# Patient Record
Sex: Female | Born: 1975 | Race: Black or African American | Hispanic: No | Marital: Single | Smoking: Never smoker
Health system: Southern US, Community
[De-identification: ages and names within clinical notes are randomized; demographics above are authoritative.]

## PROBLEM LIST (undated history)

## (undated) HISTORY — PX: ABDOMINAL HYSTERECTOMY: SHX81

## (undated) HISTORY — PX: ADENOIDECTOMY: SUR15

## (undated) HISTORY — PX: CHOLECYSTECTOMY: SHX55

---

## 2014-10-04 ENCOUNTER — Ambulatory Visit
Admission: EM | Admit: 2014-10-04 | Discharge: 2014-10-04 | Disposition: A | Discharge status: Home / Self Care | Payer: Medicaid Other | Attending: Family Medicine | Admitting: Family Medicine

## 2014-10-04 DIAGNOSIS — L02419 Cutaneous abscess of limb, unspecified: Secondary | ICD-10-CM

## 2014-10-04 LAB — PREGNANCY, URINE: PREG TEST UR: NEGATIVE

## 2014-10-04 MED ORDER — SULFAMETHOXAZOLE-TRIMETHOPRIM 800-160 MG PO TABS: 1.0000 | ORAL_TABLET | Freq: Two times a day (BID) | ORAL | Status: DC

## 2014-10-04 MED ORDER — CEFTRIAXONE SODIUM 1 G IJ SOLR
1.0000 g | Freq: Once | INTRAMUSCULAR | Status: AC
  Administered 2014-10-04: 1 g via INTRAMUSCULAR

## 2014-10-04 MED ORDER — KETOROLAC TROMETHAMINE 60 MG/2ML IM SOLN
60.0000 mg | Freq: Once | INTRAMUSCULAR | Status: AC
  Administered 2014-10-04: 60 mg via INTRAMUSCULAR

## 2014-10-04 NOTE — ED Provider Notes (Signed)
CSN: [id number]     Arrival date & time 10/04/14  1638 History   First MD Initiated Contact with Patient 10/04/14 1815     Chief Complaint  Patient presents with  . Abscess   (Consider location/radiation/quality/duration/timing/severity/associated sxs/prior Treatment) HPI  39 yo F presents concerned about large left axillary mass-reported as present  X 4 days and increasing in size- very painful. Denies previous episode, denies DM. Now living in [place] and saw [facility] signs as she left the [employer].   History reviewed. No pertinent past medical history. Past Surgical History  Procedure Laterality Date  . Abdominal hysterectomy      uterine fibroid removal - still has uterus  . Adenoidectomy    . Cholecystectomy     History reviewed. No pertinent family history. Social History  Substance Use Topics  . Smoking status: Never Smoker   . Smokeless tobacco: None  . Alcohol Use: No   OB History    No data available     Review of Systems  Review of 10 systems negative for acute change except as referenced in HPI   Allergies  Review of patient's allergies indicates no known allergies.  Home Medications   Prior to Admission medications   Medication Sig Start Date End Date Taking? Authorizing Provider  cephALEXin (KEFLEX) 500 MG capsule Take 1 capsule (500 mg total) by mouth 4 (four) times daily. 10/05/14   Trixie Dredge, PA-C  HYDROcodone-acetaminophen (NORCO/VICODIN) 5-325 MG per tablet Take 1 tablet by mouth every 4 (four) hours as needed for moderate pain or severe pain. 10/05/14   Trixie Dredge, PA-C  sulfamethoxazole-trimethoprim (BACTRIM DS,SEPTRA DS) 800-160 MG per tablet Take 1 tablet by mouth 2 (two) times daily. 10/05/14 10/12/14  Trixie Dredge, PA-C   Meds Ordered and Administered this Visit   Medications  cefTRIAXone (ROCEPHIN) injection 1 g (1 g Intramuscular Given 10/04/14 1850)  ketorolac (TORADOL) injection 60 mg (60 mg Intramuscular Given 10/04/14 1850)  well  tolerated by patient  BP 137/79 mmHg  Pulse 84  Temp(Src) 98.6 F (37 C) (Tympanic)  Resp 17  Ht 5\' 4"  (1.626 m)  Wt 220 lb (99.791 kg)  BMI 37.74 kg/m2  SpO2 100%  LMP 09/07/2014 (Approximate) No data found.   Physical Exam Constitutional: Alert and oriented, well appearing, VS are noted,  General : mild distress;reporting pain HEENT:  Head:normocephalic, atraumatic,                Eyes: conjugate gaze,negative conjunctiva     Ears:hearing grossly  WNL     Nose:normal,     Mouth/throat :Mucous membranes moist,  Neck :  supple without thyromegaly Lung:   effort and breath sounds normal  Heart:   normal rate,regular rhythm,  Abd :    No distention MSK:   nontender, normal ROM all extremities;normal flexion; ambulatory, on and off table without assistance             Left axilla with large tender mass, suspect abscess-very tender- too large to be addressed in our facility for I&D Neuro: CN ll-Xl as tested,grossly intact; normal gait, normal speech and language Skin:  Warm,dry,intact Psych: mood and affect WNL  ED Course  Procedures (including critical care time)  Labs Review Labs Reviewed  PREGNANCY, URINE   Results for orders placed or performed during the hospital encounter of 10/04/14  Pregnancy, urine  Result Value Ref Range   Preg Test, Ur NEGATIVE NEGATIVE    Imaging Review No results found.  Discussed patient  with Dr Thurmond Butts - the mass is too large for [place] to address in [facility] and she will be referred to [facility] ER near her new home.. Will Rx for pain and add Rocephin IM tonight , These were well tolerated - and gave her information about abscess, and I&D. Started on PO antibiotics,  Use warm packs until  Referral to [facility] tomorrow     MDM   1. Axillary abscess    Plan: 1. Test results and diagnosis reviewed with patient 2. Rx as per orders; risks, benefits, potential side effects reviewed with patient 3. Recommend supportive treatment with ibuprofen  /tylenol cyclic 4. Seek additional medical care if symptoms worsen or don't improve- plans [facility] ER tomorrow  Discharge Medication List as of 10/04/2014  7:07 PM    START taking these medications   Details  sulfamethoxazole-trimethoprim (BACTRIM DS,SEPTRA DS) 800-160 MG per tablet Take 1 tablet by mouth 2 (two) times daily., Starting 10/04/2014, Until Thu 10/11/14, Print          Rae Halsted, PA-C 10/05/14 2225

## 2014-10-04 NOTE — Discharge Instructions (Signed)
°  You had an antibiotic and a pain medication by injections in Urgent Care. I have given you Rx for Oral antibiotic to start ASAP Please go to [facility] ER tomorrow for evaluation Take Ibuprofen 600 -800 mg ( 3-4 tablets ) three times a day with meals for next 2-3 days as needed  Abscess An abscess is an infected area that contains a collection of pus and debris.It can occur in almost any part of the body. An abscess is also known as a furuncle or boil. CAUSES  An abscess occurs when tissue gets infected. This can occur from blockage of oil or sweat glands, infection of hair follicles, or a minor injury to the skin. As the body tries to fight the infection, pus collects in the area and creates pressure under the skin. This pressure causes pain. People with weakened immune systems have difficulty fighting infections and get certain abscesses more often.  SYMPTOMS Usually an abscess develops on the skin and becomes a painful mass that is red, warm, and tender. If the abscess forms under the skin, you may feel a moveable soft area under the skin. Some abscesses break open (rupture) on their own, but most will continue to get worse without care. The infection can spread deeper into the body and eventually into the bloodstream, causing you to feel ill.  DIAGNOSIS  Your caregiver will take your medical history and perform a physical exam. A sample of fluid may also be taken from the abscess to determine what is causing your infection. TREATMENT  Your caregiver may prescribe antibiotic medicines to fight the infection. However, taking antibiotics alone usually does not cure an abscess. Your caregiver may need to make a small cut (incision) in the abscess to drain the pus. In some cases, gauze is packed into the abscess to reduce pain and to continue draining the area. HOME CARE INSTRUCTIONS   Only take over-the-counter or prescription medicines for pain, discomfort, or fever as directed by your  caregiver.  If you were prescribed antibiotics, take them as directed. Finish them even if you start to feel better.  If gauze is used, follow your caregiver's directions for changing the gauze.  To avoid spreading the infection:  Keep your draining abscess covered with a bandage.  Wash your hands well.  Do not share personal care items, towels, or whirlpools with others.  Avoid skin contact with others.  Keep your skin and clothes clean around the abscess.  Keep all follow-up appointments as directed by your caregiver. SEEK MEDICAL CARE IF:   You have increased pain, swelling, redness, fluid drainage, or bleeding.  You have muscle aches, chills, or a general ill feeling.  You have a fever. MAKE SURE YOU:   Understand these instructions.  Will watch your condition.  Will get help right away if you are not doing well or get worse. Document Released: 10/15/2004 Document Revised: 07/07/2011 Document Reviewed: 03/20/2011 [facility] Patient Information 2015 [place], [place]. This information is not intended to replace advice given to you by your health care provider. Make sure you discuss any questions you have with your health care provider.

## 2014-10-04 NOTE — ED Notes (Signed)
States just moved to [facility] on Saturday and Sunday noted "lump" under left axilla. Extremely painful.

## 2014-10-05 ENCOUNTER — Emergency Department (HOSPITAL_COMMUNITY)
Admission: EM | Admit: 2014-10-05 | Discharge: 2014-10-05 | Disposition: A | Discharge status: Home / Self Care | Payer: Self-pay | Attending: Emergency Medicine | Admitting: Emergency Medicine

## 2014-10-05 ENCOUNTER — Encounter (HOSPITAL_COMMUNITY): Payer: Self-pay | Admitting: *Deleted

## 2014-10-05 DIAGNOSIS — L02412 Cutaneous abscess of left axilla: Secondary | ICD-10-CM | POA: Insufficient documentation

## 2014-10-05 DIAGNOSIS — L0390 Cellulitis, unspecified: Secondary | ICD-10-CM

## 2014-10-05 DIAGNOSIS — L03112 Cellulitis of left axilla: Secondary | ICD-10-CM | POA: Insufficient documentation

## 2014-10-05 DIAGNOSIS — L0291 Cutaneous abscess, unspecified: Secondary | ICD-10-CM

## 2014-10-05 MED ORDER — CEPHALEXIN 500 MG PO CAPS: 500.0000 mg | ORAL_CAPSULE | Freq: Four times a day (QID) | ORAL | Status: DC

## 2014-10-05 MED ORDER — SULFAMETHOXAZOLE-TRIMETHOPRIM 800-160 MG PO TABS: 1.0000 | ORAL_TABLET | Freq: Two times a day (BID) | ORAL | Status: AC

## 2014-10-05 MED ORDER — LIDOCAINE-EPINEPHRINE (PF) 2 %-1:200000 IJ SOLN
10.0000 mL | Freq: Once | INTRAMUSCULAR | Status: AC
  Administered 2014-10-05: 10 mL via INTRADERMAL
  Filled 2014-10-05: qty 20

## 2014-10-05 MED ORDER — HYDROCODONE-ACETAMINOPHEN 5-325 MG PO TABS: 1.0000 | ORAL_TABLET | ORAL | Status: DC | PRN

## 2014-10-05 NOTE — ED Provider Notes (Signed)
CSN: [id number]     Arrival date & time 10/05/14  1208 History  This chart was scribed for non-physician practitioner, Trixie Dredge, PA-C working with Margarita Grizzle, MD, by Jarvis Morgan, ED Scribe. This patient was seen in room TR09C/TR09C and the patient's care was started at 12:42 PM.     Chief Complaint  Patient presents with  . Abscess    The history is provided by the patient. No language interpreter was used.    HPI Comments: Hannah Price is a 39 y.o. female who presents to the Emergency Department complaining of a constant, painful, red, mass to left axilla onset 1 week.  She was seen at [facility] yesterday and was given injection of Rocephin, toradol and rx for Bactrim. She was told she needed to be sent here for f/u and possible surgical referral. She denies any h/o abscesses.  Has had small nontender nodule there for years that never bothered her until it recently increased in size and pain.  She has not taken any other meds PTA. She denies any chest pain, SOB, cough, fever or chills, weakness or numbness of the left arm.   History reviewed. No pertinent past medical history. Past Surgical History  Procedure Laterality Date  . Abdominal hysterectomy      uterine fibroid removal - still has uterus  . Adenoidectomy    . Cholecystectomy     No family history on file. Social History  Substance Use Topics  . Smoking status: Never Smoker   . Smokeless tobacco: None  . Alcohol Use: No   OB History    No data available     Review of Systems  Constitutional: Negative for fever and chills.  Respiratory: Negative for cough and shortness of breath.   Cardiovascular: Negative for chest pain.  Musculoskeletal: Negative for myalgias and neck stiffness.  Skin: Positive for color change.  Allergic/Immunologic: Negative for immunocompromised state.  Neurological: Negative for weakness and numbness.  Hematological: Does not bruise/bleed easily.  Psychiatric/Behavioral: Negative for  self-injury.      Allergies  Review of patient's allergies indicates no known allergies.  Home Medications   Prior to Admission medications   Medication Sig Start Date End Date Taking? Authorizing Provider  sulfamethoxazole-trimethoprim (BACTRIM DS,SEPTRA DS) 800-160 MG per tablet Take 1 tablet by mouth 2 (two) times daily. 10/04/14 10/11/14  Rae Halsted, PA-C   Triage Vitals: BP 149/94 mmHg  Pulse 92  Temp(Src) 97.6 F (36.4 C) (Oral)  Resp 18  Ht  (1.626 m)  Wt 226 lb (102.513 kg)  BMI 38.77 kg/m2  SpO2 100%  LMP 09/07/2014 (Approximate)  Physical Exam  Constitutional: She appears well-developed and well-nourished. No distress.  HENT:  Head: Normocephalic and atraumatic.  Neck: Neck supple.  Cardiovascular: Normal rate and intact distal pulses.   Pulmonary/Chest: Effort normal.  Neurological: She is alert. She exhibits normal muscle tone.  Skin: She is not diaphoretic.  Left axilla with area of fluctuance, tenderness to palpation overlying and surrounding erythema and warmth  Psychiatric: She has a normal mood and affect. Her behavior is normal.  Nursing note and vitals reviewed.   ED Course  Procedures (including critical care time)  DIAGNOSTIC STUDIES: Oxygen Saturation is 100% on RA, normal by my interpretation.    COORDINATION OF CARE:    Labs Review Labs Reviewed - No data to display  Imaging Review No results found. I have personally reviewed and evaluated these images and lab results as part of my medical decision-making.  EKG Interpretation None       EMERGENCY DEPARTMENT US SOFT TISSUE INTERPRETATION "Study: Limited Ultrasound of the noted body part in comments below"  INDICATIONS: Soft tissue infection Multiple views of the body part are obtained with a multi-frequency linear probe  PERFORMED BY:  Myself  IMAGES ARCHIVED?: Yes  SIDE:Left  BODY PART:Axilla  FINDINGS: Abcess present and Cellulitis present  LIMITATIONS:  Body  Habitus and Emergent Procedure  INTERPRETATION:  Abcess present and Cellulitis present  COMMENT:  Left axilla with large abscess vs infected sebaceous cyst and overlying cellulitis.    INCISION AND DRAINAGE Performed by: Trixie Dredge Consent: Verbal consent obtained. Risks and benefits: risks, benefits and alternatives were discussed Type: abscess  Body area: left axilla   Anesthesia: local infiltration  Incision was made with a scalpel.  Local anesthetic: lidocaine 2% with epinephrine  Anesthetic total: 5 ml  Complexity: complex Blunt dissection to break up loculations  Drainage: purulent with dark caseous material  Drainage amount: large  Packing material: none  Irrigated with Normal Saline  Patient tolerance: Patient tolerated the procedure well with no immediate complications.     MDM   Final diagnoses:  Abscess and cellulitis    Afebrile, nontoxic patient with abscess and overlying cellulitis of the left axilla.  Likely infected sebaceous cyst.   D/C home with norco, keflex, vicodin, 2 day recheck.   Discussed result, findings, treatment, and follow up  with patient.  Pt given return precautions.  Pt verbalizes understanding and agrees with plan.        I personally performed the services described in this documentation, which was scribed in my presence. The recorded information has been reviewed and is accurate.    Trixie Dredge, PA-C 10/05/14 1425  Margarita Grizzle, MD 10/05/14 1550

## 2014-10-05 NOTE — Discharge Instructions (Signed)
Read the information below.  Use the prescribed medication as directed.  Please discuss all new medications with your pharmacist.  Do not take additional tylenol while taking the prescribed pain medication to avoid overdose.  You may return to the Emergency Department at any time for worsening condition or any new symptoms that concern you.  If you develop increased redness, swelling, uncontrolled pain, or fevers greater than 100.4, return to the ER immediately for a recheck.     Abscess Care After An abscess (also called a boil or furuncle) is an infected area that contains a collection of pus. Signs and symptoms of an abscess include pain, tenderness, redness, or hardness, or you may feel a moveable soft area under your skin. An abscess can occur anywhere in the body. The infection may spread to surrounding tissues causing cellulitis. A cut (incision) by the surgeon was made over your abscess and the pus was drained out. Gauze may have been packed into the space to provide a drain that will allow the cavity to heal from the inside outwards. The boil may be painful for 5 to 7 days. Most people with a boil do not have high fevers. Your abscess, if seen early, may not have localized, and may not have been lanced. If not, another appointment may be required for this if it does not get better on its own or with medications. HOME CARE INSTRUCTIONS   Only take over-the-counter or prescription medicines for pain, discomfort, or fever as directed by your caregiver.  When you bathe, soak and then remove gauze or iodoform packs at least daily or as directed by your caregiver. You may then wash the wound gently with mild soapy water. Repack with gauze or do as your caregiver directs. SEEK IMMEDIATE MEDICAL CARE IF:   You develop increased pain, swelling, redness, drainage, or bleeding in the wound site.  You develop signs of generalized infection including muscle aches, chills, fever, or a general ill  feeling.  An oral temperature above 102 F (38.9 C) develops, not controlled by medication. See your caregiver for a recheck if you develop any of the symptoms described above. If medications (antibiotics) were prescribed, take them as directed. Document Released: 07/24/2004 Document Revised: 03/30/2011 Document Reviewed: 03/21/2007 [facility] Patient Information 2015 [place], [place]. This information is not intended to replace advice given to you by your health care provider. Make sure you discuss any questions you have with your health care provider.  Abscess An abscess is an infected area that contains a collection of pus and debris.It can occur in almost any part of the body. An abscess is also known as a furuncle or boil. CAUSES  An abscess occurs when tissue gets infected. This can occur from blockage of oil or sweat glands, infection of hair follicles, or a minor injury to the skin. As the body tries to fight the infection, pus collects in the area and creates pressure under the skin. This pressure causes pain. People with weakened immune systems have difficulty fighting infections and get certain abscesses more often.  SYMPTOMS Usually an abscess develops on the skin and becomes a painful mass that is red, warm, and tender. If the abscess forms under the skin, you may feel a moveable soft area under the skin. Some abscesses break open (rupture) on their own, but most will continue to get worse without care. The infection can spread deeper into the body and eventually into the bloodstream, causing you to feel ill.  DIAGNOSIS  Your  caregiver will take your medical history and perform a physical exam. A sample of fluid may also be taken from the abscess to determine what is causing your infection. TREATMENT  Your caregiver may prescribe antibiotic medicines to fight the infection. However, taking antibiotics alone usually does not cure an abscess. Your caregiver may need to make a small cut  (incision) in the abscess to drain the pus. In some cases, gauze is packed into the abscess to reduce pain and to continue draining the area. HOME CARE INSTRUCTIONS   Only take over-the-counter or prescription medicines for pain, discomfort, or fever as directed by your caregiver.  If you were prescribed antibiotics, take them as directed. Finish them even if you start to feel better.  If gauze is used, follow your caregiver's directions for changing the gauze.  To avoid spreading the infection:  Keep your draining abscess covered with a bandage.  Wash your hands well.  Do not share personal care items, towels, or whirlpools with others.  Avoid skin contact with others.  Keep your skin and clothes clean around the abscess.  Keep all follow-up appointments as directed by your caregiver. SEEK MEDICAL CARE IF:   You have increased pain, swelling, redness, fluid drainage, or bleeding.  You have muscle aches, chills, or a general ill feeling.  You have a fever. MAKE SURE YOU:   Understand these instructions.  Will watch your condition.  Will get help right away if you are not doing well or get worse. Document Released: 10/15/2004 Document Revised: 07/07/2011 Document Reviewed: 03/20/2011 [facility] Patient Information 2015 [place], [place]. This information is not intended to replace advice given to you by your health care provider. Make sure you discuss any questions you have with your health care provider.  Cellulitis Cellulitis is an infection of the skin and the tissue beneath it. The infected area is usually red and tender. Cellulitis occurs most often in the arms and lower legs.  CAUSES  Cellulitis is caused by bacteria that enter the skin through cracks or cuts in the skin. The most common types of bacteria that cause cellulitis are staphylococci and streptococci. SIGNS AND SYMPTOMS   Redness and warmth.  Swelling.  Tenderness or pain.  Fever. DIAGNOSIS  Your  health care provider can usually determine what is wrong based on a physical exam. Blood tests may also be done. TREATMENT  Treatment usually involves taking an antibiotic medicine. HOME CARE INSTRUCTIONS   Take your antibiotic medicine as directed by your health care provider. Finish the antibiotic even if you start to feel better.  Keep the infected arm or leg elevated to reduce swelling.  Apply a warm cloth to the affected area up to 4 times per day to relieve pain.  Take medicines only as directed by your health care provider.  Keep all follow-up visits as directed by your health care provider. SEEK MEDICAL CARE IF:   You notice red streaks coming from the infected area.  Your red area gets larger or turns dark in color.  Your bone or joint underneath the infected area becomes painful after the skin has healed.  Your infection returns in the same area or another area.  You notice a swollen bump in the infected area.  You develop new symptoms.  You have a fever. SEEK IMMEDIATE MEDICAL CARE IF:   You feel very sleepy.  You develop vomiting or diarrhea.  You have a general ill feeling (malaise) with muscle aches and pains. MAKE SURE YOU:  Understand these instructions.  Will watch your condition.  Will get help right away if you are not doing well or get worse. Document Released: 10/15/2004 Document Revised: 05/22/2013 Document Reviewed: 03/23/2011 [facility] Patient Information 2015 [place], [place]. This information is not intended to replace advice given to you by your health care provider. Make sure you discuss any questions you have with your health care provider.

## 2014-10-05 NOTE — ED Notes (Signed)
Pt states she was UC yesterday for abscess to L axilla.  Was given IM rocephin and told to come here so the abscess could be drained.

## 2014-10-08 ENCOUNTER — Encounter (HOSPITAL_COMMUNITY): Payer: Self-pay | Admitting: Emergency Medicine

## 2014-10-08 ENCOUNTER — Emergency Department (INDEPENDENT_AMBULATORY_CARE_PROVIDER_SITE_OTHER)
Admission: EM | Admit: 2014-10-08 | Discharge: 2014-10-08 | Disposition: A | Discharge status: Home / Self Care | Payer: Medicaid Other | Source: Home / Self Care | Attending: Family Medicine | Admitting: Family Medicine

## 2014-10-08 DIAGNOSIS — Z09 Encounter for follow-up examination after completed treatment for conditions other than malignant neoplasm: Secondary | ICD-10-CM

## 2014-10-08 NOTE — Discharge Instructions (Signed)
Warm cloth soaks and finish antibiotics, return as needed.

## 2014-10-08 NOTE — ED Notes (Signed)
Seen at [facility] ucc on 9/15.  Prescribed medications and sent to [place] ed on 9/16.  I/d of wound.  Today here for follow up.  Patient feels much better.  Wound to left axilla, healing wound.

## 2014-10-08 NOTE — ED Provider Notes (Signed)
CSN: [id number]     Arrival date & time 10/08/14  1308 History   First MD Initiated Contact with Patient 10/08/14 1348     Chief Complaint  Patient presents with  . Follow-up   (Consider location/radiation/quality/duration/timing/severity/associated sxs/prior Treatment) Patient is a 39 y.o. female presenting with abscess. The history is provided by the patient.  Abscess Location:  Shoulder/arm Shoulder/arm abscess location:  L axilla Abscess quality: fluctuance and painful   Red streaking: no   Duration:  3 days Progression:  Partially resolved (had I+D on 9/16 in er, pt has changed dsg, no packing  seen) Associated symptoms: no fever     History reviewed. No pertinent past medical history. Past Surgical History  Procedure Laterality Date  . Abdominal hysterectomy      uterine fibroid removal - still has uterus  . Adenoidectomy    . Cholecystectomy     No family history on file. Social History  Substance Use Topics  . Smoking status: Never Smoker   . Smokeless tobacco: None  . Alcohol Use: No   OB History    No data available     Review of Systems  Constitutional: Negative for fever.  Skin: Positive for rash and wound.  All other systems reviewed and are negative.   Allergies  Review of patient's allergies indicates no known allergies.  Home Medications   Prior to Admission medications   Medication Sig Start Date End Date Taking? Authorizing Provider  cephALEXin (KEFLEX) 500 MG capsule Take 1 capsule (500 mg total) by mouth 4 (four) times daily. 10/05/14   Trixie Dredge, PA-C  HYDROcodone-acetaminophen (NORCO/VICODIN) 5-325 MG per tablet Take 1 tablet by mouth every 4 (four) hours as needed for moderate pain or severe pain. 10/05/14   Trixie Dredge, PA-C  sulfamethoxazole-trimethoprim (BACTRIM DS,SEPTRA DS) 800-160 MG per tablet Take 1 tablet by mouth 2 (two) times daily. 10/05/14 10/12/14  Trixie Dredge, PA-C   Meds Ordered and Administered this Visit  Medications - No  data to display  BP 115/72 mmHg  Pulse 86  Temp(Src) 98.2 F (36.8 C) (Oral)  Resp 12  SpO2 100%  LMP 10/08/2014 No data found.   Physical Exam  Constitutional: She is oriented to person, place, and time. She appears well-developed and well-nourished. No distress.  Neurological: She is alert and oriented to person, place, and time.  Skin: Skin is warm and dry. No erythema.  Sl fluctuant mass to left axilla, purulent fluid expressed.   Nursing note and vitals reviewed.   ED Course  Procedures (including critical care time)  Labs Review Labs Reviewed - No data to display  Imaging Review No results found.   Visual Acuity Review  Right Eye Distance:   Left Eye Distance:   Bilateral Distance:    Right Eye Near:   Left Eye Near:    Bilateral Near:         MDM   1. Encounter for recheck of abscess following incision and drainage        Linna Hoff, MD 10/08/14 1424

## 2015-11-09 ENCOUNTER — Emergency Department (HOSPITAL_COMMUNITY): Payer: Medicaid Other

## 2015-11-09 ENCOUNTER — Encounter (HOSPITAL_COMMUNITY): Payer: Self-pay | Admitting: Emergency Medicine

## 2015-11-09 ENCOUNTER — Emergency Department (HOSPITAL_COMMUNITY)
Admission: EM | Admit: 2015-11-09 | Discharge: 2015-11-09 | Disposition: A | Discharge status: Home / Self Care | Payer: Medicaid Other | Attending: Emergency Medicine | Admitting: Emergency Medicine

## 2015-11-09 DIAGNOSIS — R51 Headache: Secondary | ICD-10-CM | POA: Diagnosis not present

## 2015-11-09 DIAGNOSIS — D500 Iron deficiency anemia secondary to blood loss (chronic): Secondary | ICD-10-CM | POA: Diagnosis not present

## 2015-11-09 DIAGNOSIS — N938 Other specified abnormal uterine and vaginal bleeding: Secondary | ICD-10-CM

## 2015-11-09 DIAGNOSIS — R519 Headache, unspecified: Secondary | ICD-10-CM

## 2015-11-09 LAB — URINE MICROSCOPIC-ADD ON

## 2015-11-09 LAB — CBC WITH DIFFERENTIAL/PLATELET
BASOS ABS: 0.1 10*3/uL (ref 0.0–0.1)
Basophils Relative: 1 %
Eosinophils Absolute: 0.1 10*3/uL (ref 0.0–0.7)
Eosinophils Relative: 2 %
HEMATOCRIT: 29.6 % — AB (ref 36.0–46.0)
Hemoglobin: 8.7 g/dL — ABNORMAL LOW (ref 12.0–15.0)
LYMPHS ABS: 1.5 10*3/uL (ref 0.7–4.0)
LYMPHS PCT: 29 %
MCH: 19.8 pg — ABNORMAL LOW (ref 26.0–34.0)
MCHC: 29.4 g/dL — ABNORMAL LOW (ref 30.0–36.0)
MCV: 67.4 fL — ABNORMAL LOW (ref 78.0–100.0)
MONOS PCT: 8 %
Monocytes Absolute: 0.4 10*3/uL (ref 0.1–1.0)
NEUTROS PCT: 60 %
Neutro Abs: 3.1 10*3/uL (ref 1.7–7.7)
Platelets: 387 10*3/uL (ref 150–400)
RBC: 4.39 MIL/uL (ref 3.87–5.11)
RDW: 16.9 % — AB (ref 11.5–15.5)
WBC: 5.2 10*3/uL (ref 4.0–10.5)

## 2015-11-09 LAB — COMPREHENSIVE METABOLIC PANEL
ALT: 10 U/L — ABNORMAL LOW (ref 14–54)
ANION GAP: 7 (ref 5–15)
AST: 16 U/L (ref 15–41)
Albumin: 3.7 g/dL (ref 3.5–5.0)
Alkaline Phosphatase: 61 U/L (ref 38–126)
BILIRUBIN TOTAL: 0.9 mg/dL (ref 0.3–1.2)
BUN: 8 mg/dL (ref 6–20)
CO2: 25 mmol/L (ref 22–32)
Calcium: 8.8 mg/dL — ABNORMAL LOW (ref 8.9–10.3)
Chloride: 107 mmol/L (ref 101–111)
Creatinine, Ser: 0.71 mg/dL (ref 0.44–1.00)
Glucose, Bld: 88 mg/dL (ref 65–99)
POTASSIUM: 3.5 mmol/L (ref 3.5–5.1)
Sodium: 139 mmol/L (ref 135–145)
TOTAL PROTEIN: 7.7 g/dL (ref 6.5–8.1)

## 2015-11-09 LAB — URINALYSIS, ROUTINE W REFLEX MICROSCOPIC
Glucose, UA: NEGATIVE mg/dL
KETONES UR: 15 mg/dL — AB
NITRITE: POSITIVE — AB
PH: 7 (ref 5.0–8.0)
PROTEIN: 100 mg/dL — AB
Specific Gravity, Urine: 1.017 (ref 1.005–1.030)

## 2015-11-09 LAB — PREGNANCY, URINE: Preg Test, Ur: NEGATIVE

## 2015-11-09 MED ORDER — MEDROXYPROGESTERONE ACETATE 10 MG PO TABS: 10.0000 mg | ORAL_TABLET | Freq: Every day | ORAL | 0 refills | Status: DC

## 2015-11-09 MED ORDER — METOCLOPRAMIDE HCL 5 MG/ML IJ SOLN
10.0000 mg | Freq: Once | INTRAMUSCULAR | Status: AC
  Administered 2015-11-09: 10 mg via INTRAVENOUS
  Filled 2015-11-09: qty 2

## 2015-11-09 MED ORDER — SODIUM CHLORIDE 0.9 % IV BOLUS (SEPSIS)
1000.0000 mL | Freq: Once | INTRAVENOUS | Status: AC
  Administered 2015-11-09: 1000 mL via INTRAVENOUS

## 2015-11-09 MED ORDER — DIPHENHYDRAMINE HCL 50 MG/ML IJ SOLN
25.0000 mg | Freq: Once | INTRAMUSCULAR | Status: AC
  Administered 2015-11-09: 25 mg via INTRAVENOUS
  Filled 2015-11-09: qty 1

## 2015-11-09 NOTE — ED Provider Notes (Signed)
[facility] DEPT Provider Note   CSN: [id number] Arrival date & time: 11/09/15  0813     History   Chief Complaint Chief Complaint  Patient presents with  . Headache    HPI Hannah Price is a 40 y.o. female.  Pt presents to the ED this morning due to a headache that has lasted for 5 days.  Pt said she has never had a headache like this in the past.  She said the pain in on the right side of her head.  She feels nauseous, but has not been vomiting.      History reviewed. No pertinent past medical history.  There are no active problems to display for this patient.   Past Surgical History:  Procedure Laterality Date  . ABDOMINAL HYSTERECTOMY     uterine fibroid removal - still has uterus  . ADENOIDECTOMY    . CHOLECYSTECTOMY      OB History    No data available       Home Medications    Prior to Admission medications   Medication Sig Start Date End Date Taking? Authorizing Provider  cephALEXin (KEFLEX) 500 MG capsule Take 1 capsule (500 mg total) by mouth 4 (four) times daily. Patient not taking: Reported on 11/09/2015 10/05/14   Trixie Dredge, PA-C  HYDROcodone-acetaminophen (NORCO/VICODIN) 5-325 MG per tablet Take 1 tablet by mouth every 4 (four) hours as needed for moderate pain or severe pain. Patient not taking: Reported on 11/09/2015 10/05/14   Trixie Dredge, PA-C  medroxyPROGESTERone (PROVERA) 10 MG tablet Take 1 tablet (10 mg total) by mouth daily. 11/09/15   Jacalyn Lefevre, MD    Family History No family history on file.  Social History Social History  Substance Use Topics  . Smoking status: Never Smoker  . Smokeless tobacco: Never Used  . Alcohol use No     Allergies   Review of patient's allergies indicates no known allergies.   Review of Systems Review of Systems  Gastrointestinal: Positive for nausea.  Neurological: Positive for headaches.  All other systems reviewed and are negative.    Physical Exam Updated Vital Signs BP 146/80  (BP Location: Right Arm)   Pulse 81   Temp 99.3 F (37.4 C) (Oral)   Resp 17   Ht 5\' 4"  (1.626 m)   Wt 220 lb (99.8 kg)   LMP 11/08/2015   SpO2 99%   BMI 37.76 kg/m   Physical Exam  Constitutional: She is oriented to person, place, and time. She appears well-developed and well-nourished.  HENT:  Head: Normocephalic and atraumatic.  Right Ear: External ear normal.  Left Ear: External ear normal.  Nose: Nose normal.  Mouth/Throat: Oropharynx is clear and moist.  Eyes: Conjunctivae and EOM are normal. Pupils are equal, round, and reactive to light.  Neck: Normal range of motion. Neck supple.  Cardiovascular: Normal rate, regular rhythm, normal heart sounds and intact distal pulses.   Pulmonary/Chest: Effort normal and breath sounds normal.  Abdominal: Soft. Bowel sounds are normal.  Musculoskeletal: Normal range of motion.  Neurological: She is alert and oriented to person, place, and time.  Skin: Skin is warm.  Psychiatric: She has a normal mood and affect. Her behavior is normal. Judgment and thought content normal.  Nursing note and vitals reviewed.    ED Treatments / Results  Labs (all labs ordered are listed, but only abnormal results are displayed) Labs Reviewed  COMPREHENSIVE METABOLIC PANEL - Abnormal; Notable for the following:  Result Value   Calcium 8.8 (*)    ALT 10 (*)    All other components within normal limits  CBC WITH DIFFERENTIAL/PLATELET - Abnormal; Notable for the following:    Hemoglobin 8.7 (*)    HCT 29.6 (*)    MCV 67.4 (*)    MCH 19.8 (*)    MCHC 29.4 (*)    RDW 16.9 (*)    All other components within normal limits  URINALYSIS, ROUTINE W REFLEX MICROSCOPIC (NOT AT [facility]) - Abnormal; Notable for the following:    Color, Urine RED (*)    APPearance TURBID (*)    Hgb urine dipstick LARGE (*)    Bilirubin Urine MODERATE (*)    Ketones, ur 15 (*)    Protein, ur 100 (*)    Nitrite POSITIVE (*)    Leukocytes, UA MODERATE (*)    All other  components within normal limits  URINE MICROSCOPIC-ADD ON - Abnormal; Notable for the following:    Squamous Epithelial / LPF 0-5 (*)    Bacteria, UA FEW (*)    All other components within normal limits  PREGNANCY, URINE    EKG  EKG Interpretation None       Radiology Ct Head Wo Contrast  Result Date: 11/09/2015 CLINICAL DATA:  Headache for 5 days. EXAM: CT HEAD WITHOUT CONTRAST TECHNIQUE: Contiguous axial images were obtained from the base of the skull through the vertex without intravenous contrast. COMPARISON:  None. FINDINGS: Brain: No evidence of acute infarction, hemorrhage, hydrocephalus, extra-axial collection or mass lesion/mass effect. Vascular: No hyperdense vessel or unexpected calcification. Skull: Normal. Negative for fracture or focal lesion. Sinuses/Orbits: No acute finding. Other: None IMPRESSION: Normal unenhanced CT scan of the brain. Electronically Signed   By: Amie Portlandavid  Ormond M.D.   On: 11/09/2015 09:17    Procedures Procedures (including critical care time)  Medications Ordered in ED Medications  sodium chloride 0.9 % bolus 1,000 mL (1,000 mLs Intravenous New Bag/Given 11/09/15 0831)  metoCLOPramide (REGLAN) injection 10 mg (10 mg Intravenous Given 11/09/15 0831)  diphenhydrAMINE (BENADRYL) injection 25 mg (25 mg Intravenous Given 11/09/15 0831)     Initial Impression / Assessment and Plan / ED Course  I have reviewed the triage vital signs and the nursing notes.  Pertinent labs & imaging results that were available during my care of the patient were reviewed by me and considered in my medical decision making (see chart for details).  Clinical Course   Pt is feeling better.  She is on her period which is why she has blood in her urine.  I asked the pt if she has heavy periods and she said yes.  She takes iron for her chronic anemia.  I told her to increase it to BID.  She has a hx of fibroids, and had them removed years ago.  She has not seen Gyn recently.   She does not have 1 in town.  She is given provera and instructed to f/u with gyn.  Her headache is likely related to her anemia.  Pt knows to return if worse.   Final Clinical Impressions(s) / ED Diagnoses   Final diagnoses:  Acute nonintractable headache, unspecified headache type  DUB (dysfunctional uterine bleeding)  Iron deficiency anemia due to chronic blood loss    New Prescriptions New Prescriptions   MEDROXYPROGESTERONE (PROVERA) 10 MG TABLET    Take 1 tablet (10 mg total) by mouth daily.     Jacalyn LefevreJulie Phinneas Shakoor, MD 11/09/15 1012

## 2015-11-09 NOTE — ED Triage Notes (Signed)
Pt. Stated, I've had a headache for the last 5 days. No other symptoms.  I've taken Advil.

## 2015-11-09 NOTE — ED Notes (Signed)
EDP at bedside  

## 2015-11-09 NOTE — Discharge Instructions (Signed)
Take your iron twice a day.

## 2015-12-31 ENCOUNTER — Ambulatory Visit (HOSPITAL_COMMUNITY)
Admission: EM | Admit: 2015-12-31 | Discharge: 2015-12-31 | Disposition: A | Discharge status: Home / Self Care | Payer: Medicaid Other | Attending: Family Medicine | Admitting: Family Medicine

## 2015-12-31 ENCOUNTER — Encounter (HOSPITAL_COMMUNITY): Payer: Self-pay | Admitting: Family Medicine

## 2015-12-31 DIAGNOSIS — Z3201 Encounter for pregnancy test, result positive: Secondary | ICD-10-CM | POA: Diagnosis not present

## 2015-12-31 LAB — POCT PREGNANCY, URINE: Preg Test, Ur: POSITIVE — AB

## 2015-12-31 NOTE — ED Triage Notes (Signed)
Pt sts that she hasn't feeling well and she wants a pregnancy test.

## 2015-12-31 NOTE — ED Provider Notes (Signed)
CSN: [id number]     Arrival date & time 12/31/15  1549 History   First MD Initiated Contact with Patient 12/31/15 1604     Chief Complaint  Patient presents with  . Possible Pregnancy   (Consider location/radiation/quality/duration/timing/severity/associated sxs/prior Treatment) 40 year old female presents to the urgent care stating that she feels tired and treatments it to having to work a lot lately. She has no other symptoms. Chest no pelvic pain or pelvic bleeding. She request to have a pregnancy test. She states she had scant amount of vaginal bleeding approximately 2 weeks ago but was not like a regular menses.      History reviewed. No pertinent past medical history. Past Surgical History:  Procedure Laterality Date  . ABDOMINAL HYSTERECTOMY     uterine fibroid removal - still has uterus  . ADENOIDECTOMY    . CHOLECYSTECTOMY     History reviewed. No pertinent family history. Social History  Substance Use Topics  . Smoking status: Never Smoker  . Smokeless tobacco: Never Used  . Alcohol use No   OB History    No data available     Review of Systems  Constitutional: Positive for fatigue.  HENT: Negative.   Respiratory: Negative.   Genitourinary: Negative.   Musculoskeletal: Negative.   Neurological: Negative.   All other systems reviewed and are negative.   Allergies  Patient has no known allergies.  Home Medications   Prior to Admission medications   Not on File   Meds Ordered and Administered this Visit  Medications - No data to display  BP 145/76   Pulse 91   Temp 98.4 F (36.9 C)   Resp 18   LMP 11/21/2015   SpO2 100%  No data found.   Physical Exam  Constitutional: She is oriented to person, place, and time. She appears well-developed and well-nourished. No distress.  Eyes: EOM are normal.  Neck: Normal range of motion. Neck supple.  Cardiovascular: Normal rate.   Pulmonary/Chest: Effort normal. No respiratory distress.   Musculoskeletal: She exhibits no edema.  Neurological: She is alert and oriented to person, place, and time. She exhibits normal muscle tone.  Skin: Skin is warm and dry.  Psychiatric: She has a normal mood and affect.  Nursing note and vitals reviewed.   Urgent Care Course   Clinical Course     Procedures (including critical care time)  Labs Review Labs Reviewed  POCT PREGNANCY, URINE - Abnormal; Notable for the following:       Result Value   Preg Test, Ur POSITIVE (*)    All other components within normal limits    Imaging Review No results found.   Visual Acuity Review  Right Eye Distance:   Left Eye Distance:   Bilateral Distance:    Right Eye Near:   Left Eye Near:    Bilateral Near:         MDM   1. Positive pregnancy test    Your pregnancy test was positive. Follow with your primary care provider. Should you have any difficulties with your pregnancy that are urgent matters [facility].     Hayden Rasmussenavid Tylik Treese, NP 12/31/15 [phone]

## 2015-12-31 NOTE — Discharge Instructions (Signed)
Your pregnancy test was positive. Follow with your primary care provider. Should you have any difficulties with your pregnancy that are urgent matters [facility].

## 2016-05-31 ENCOUNTER — Encounter (HOSPITAL_COMMUNITY): Payer: Self-pay

## 2016-05-31 ENCOUNTER — Emergency Department (HOSPITAL_COMMUNITY)
Admission: EM | Admit: 2016-05-31 | Discharge: 2016-05-31 | Disposition: A | Discharge status: Home / Self Care | Payer: Medicaid Other | Attending: Emergency Medicine | Admitting: Emergency Medicine

## 2016-05-31 DIAGNOSIS — T7840XA Allergy, unspecified, initial encounter: Secondary | ICD-10-CM

## 2016-05-31 DIAGNOSIS — H11423 Conjunctival edema, bilateral: Secondary | ICD-10-CM

## 2016-05-31 DIAGNOSIS — H578 Other specified disorders of eye and adnexa: Secondary | ICD-10-CM | POA: Diagnosis present

## 2016-05-31 MED ORDER — DIPHENHYDRAMINE HCL 25 MG PO TABS: 25.0000 mg | ORAL_TABLET | Freq: Four times a day (QID) | ORAL | 0 refills | Status: AC

## 2016-05-31 MED ORDER — EPINEPHRINE 0.3 MG/0.3ML IJ SOAJ: 0.3000 mg | Freq: Once | INTRAMUSCULAR | 1 refills | Status: AC

## 2016-05-31 MED ORDER — FAMOTIDINE 20 MG PO TABS: 20.0000 mg | ORAL_TABLET | Freq: Two times a day (BID) | ORAL | 0 refills | Status: AC

## 2016-05-31 MED ORDER — FAMOTIDINE 20 MG PO TABS
20.0000 mg | ORAL_TABLET | Freq: Once | ORAL | Status: AC
  Administered 2016-05-31: 20 mg via ORAL
  Filled 2016-05-31: qty 1

## 2016-05-31 MED ORDER — PREDNISONE 20 MG PO TABS
60.0000 mg | ORAL_TABLET | Freq: Once | ORAL | Status: AC
  Administered 2016-05-31: 60 mg via ORAL
  Filled 2016-05-31: qty 3

## 2016-05-31 MED ORDER — DIPHENHYDRAMINE HCL 25 MG PO CAPS
25.0000 mg | ORAL_CAPSULE | Freq: Once | ORAL | Status: AC
  Administered 2016-05-31: 25 mg via ORAL
  Filled 2016-05-31: qty 1

## 2016-05-31 MED ORDER — PREDNISONE 20 MG PO TABS: 60.0000 mg | ORAL_TABLET | Freq: Every day | ORAL | 0 refills | Status: AC

## 2016-05-31 NOTE — Discharge Instructions (Signed)
Use the medications as prescribed except for the epinephrine. Using that for severe allergic reaction as we discussed.  You can also try applying ice to help with the swelling.  You can also use over-the-counter allergy eyedrops.

## 2016-05-31 NOTE — ED Triage Notes (Signed)
Patient here with bilateral eye redness and itching with swelling to eyes since eating fruit and beans this am. No rash, no tongue swelling, NAD

## 2016-05-31 NOTE — ED Provider Notes (Signed)
[facility] Provider Note   CSN: [id number] Arrival date & time: 05/31/16  [id number]     History   Chief Complaint Chief Complaint  Patient presents with  . eye redness/eye swelling    HPI Hannah Price is a 41 y.o. female.  HPI Pt presents to the ED with what she thinks is an allergic reaction.  She at some fruit and beans this am.  She had sudden onset of eye itching, redness and swelling bilaterally.   She denies any shortness of breath, throat swelling, rashes, difficulty swallowing or breathing.  She had not touched her eyes prior to the episode.  The sx persisted so she came to the ED.  She has not had anything like this in the past.    History reviewed. No pertinent past medical history.  There are no active problems to display for this patient.   Past Surgical History:  Procedure Laterality Date  . ABDOMINAL HYSTERECTOMY     uterine fibroid removal - still has uterus  . ADENOIDECTOMY    . CHOLECYSTECTOMY      OB History    No data available       Home Medications    Prior to Admission medications   Medication Sig Start Date End Date Taking? Authorizing Provider  diphenhydrAMINE (BENADRYL) 25 MG tablet Take 1 tablet (25 mg total) by mouth every 6 (six) hours. 05/31/16   Linwood DibblesKnapp, Emersyn Wyss, MD  EPINEPHrine 0.3 mg/0.3 mL IJ SOAJ injection Inject 0.3 mLs (0.3 mg total) into the muscle once. As needed For severe allergic reaction 05/31/16 05/31/16  Linwood DibblesKnapp, Jeffie Widdowson, MD  famotidine (PEPCID) 20 MG tablet Take 1 tablet (20 mg total) by mouth 2 (two) times daily. 05/31/16   Linwood DibblesKnapp, Dorlis Judice, MD  predniSONE (DELTASONE) 20 MG tablet Take 3 tablets (60 mg total) by mouth daily. 05/31/16   Linwood DibblesKnapp, Bree Heinzelman, MD    Family History No family history on file.  Social History Social History  Substance Use Topics  . Smoking status: Never Smoker  . Smokeless tobacco: Never Used  . Alcohol use No     Allergies   Patient has no known allergies.   Review of Systems Review of Systems  All other  systems reviewed and are negative.    Physical Exam Updated Vital Signs BP 129/89   Pulse 73   Temp 98.4 F (36.9 C) (Oral)   Resp 16   SpO2 100%   Physical Exam  Constitutional: She appears well-developed and well-nourished. No distress.  HENT:  Head: Normocephalic and atraumatic.  Right Ear: External ear normal.  Left Ear: External ear normal.  Mouth/Throat: Mucous membranes are normal. No uvula swelling. No oropharyngeal exudate, posterior oropharyngeal edema or posterior oropharyngeal erythema.  Eyes: EOM are normal. Right eye exhibits no discharge. Left eye exhibits no discharge. Right conjunctiva is injected. Left conjunctiva is injected. No scleral icterus.  Excessive tearing,  Edema of the upper and lower eyelids  Neck: Neck supple. No tracheal deviation present.  Cardiovascular: Normal rate and regular rhythm.   Pulmonary/Chest: Effort normal. No stridor. No respiratory distress. She has no wheezes. She has no rales.  Abdominal: She exhibits no distension.  Musculoskeletal: She exhibits no edema.  Neurological: She is alert. Cranial nerve deficit: no gross deficits.  Skin: Skin is warm and dry. No rash noted. Rash is not urticarial.  Psychiatric: She has a normal mood and affect.  Nursing note and vitals reviewed.    ED Treatments / Results    Procedures Procedures (  including critical care time)  Medications Ordered in ED Medications  diphenhydrAMINE (BENADRYL) capsule 25 mg (25 mg Oral Given 05/31/16 1024)  famotidine (PEPCID) tablet 20 mg (20 mg Oral Given 05/31/16 1024)  predniSONE (DELTASONE) tablet 60 mg (60 mg Oral Given 05/31/16 1024)     Initial Impression / Assessment and Plan / ED Course  I have reviewed the triage vital signs and the nursing notes.  Pertinent labs & imaging results that were available during my care of the patient were reviewed by me and considered in my medical decision making (see chart for details).   patient presented to the  emergency room with complaints of eye swelling and irritation. She denies rubbing her eyes are getting any chemicals or irritants in her eyes. Symptoms all started after ingesting the fruit and beans. This sounds like an allergic type reaction. Fortunately she is not having other systemic symptoms. She was treated with antihistamines and steroids. I will discharge her home with prescriptions for antihistamines and steroids. I also will give her an EpiPen to be used in the future if she has any worsening systemic allergic reactions. Plan on outpatient follow-up with an allergist  Final Clinical Impressions(s) / ED Diagnoses   Final diagnoses:  Allergic reaction, initial encounter  Conjunctival edema of both eyes    New Prescriptions New Prescriptions   DIPHENHYDRAMINE (BENADRYL) 25 MG TABLET    Take 1 tablet (25 mg total) by mouth every 6 (six) hours.   EPINEPHRINE 0.3 MG/0.3 ML IJ SOAJ INJECTION    Inject 0.3 mLs (0.3 mg total) into the muscle once. As needed For severe allergic reaction   FAMOTIDINE (PEPCID) 20 MG TABLET    Take 1 tablet (20 mg total) by mouth 2 (two) times daily.   PREDNISONE (DELTASONE) 20 MG TABLET    Take 3 tablets (60 mg total) by mouth daily.     Linwood Dibbles, MD 05/31/16 1120

## 2017-08-08 IMAGING — CT CT HEAD W/O CM
4 series · 17 of 47 positions shown, 19 images · non-contrast
Comparison: None.

CLINICAL DATA: Headache for 5 days.

EXAM:
CT HEAD WITHOUT CONTRAST
TECHNIQUE: Contiguous axial images were obtained from the base of the skull
through the vertex without intravenous contrast.

[Series 2: head without · axial · non-contrast · 0.49mm/px · z∈[-294,-174]mm · 7 of 33 slices shown, 9 images]
[im 5/33  brain]
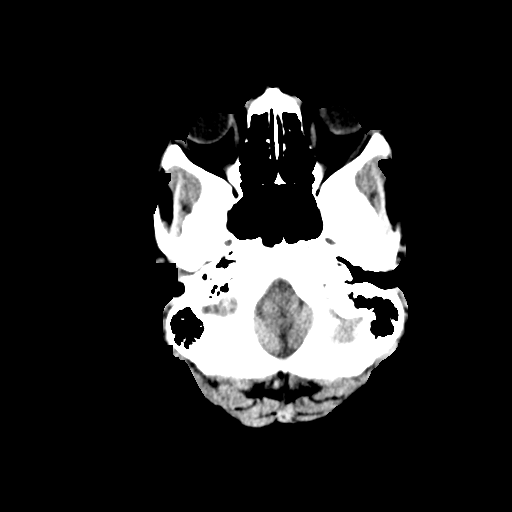
[im 5/33  bone]
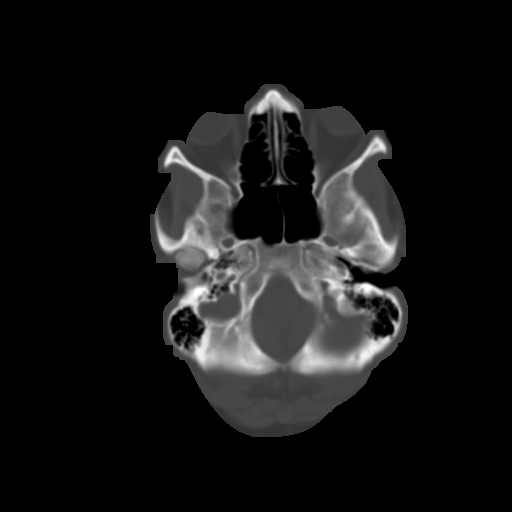
[im 9/33  brain]
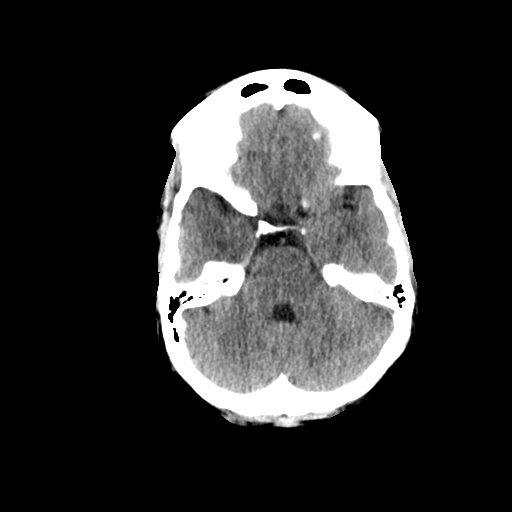
[im 13/33  brain]
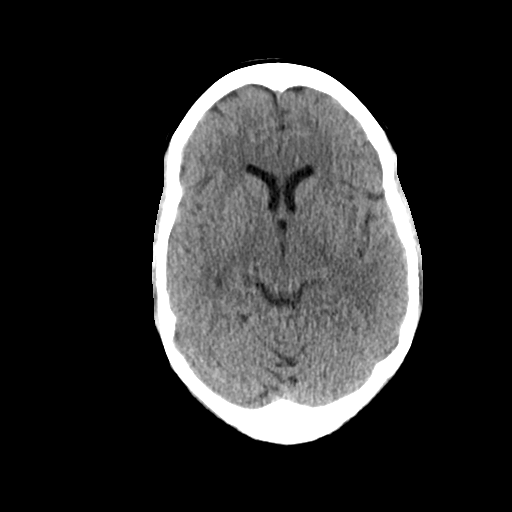
[im 17/33  brain]
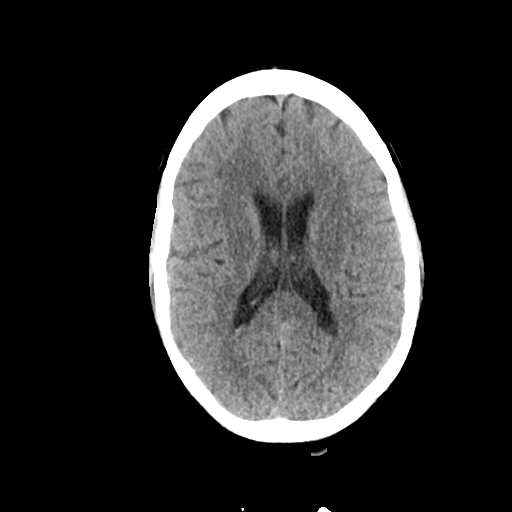
[im 21/33  brain]
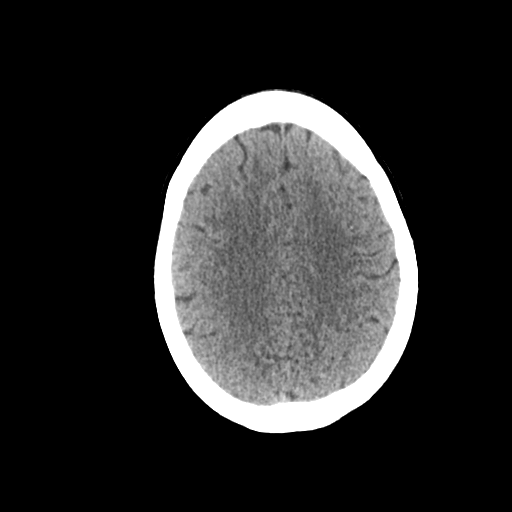
[im 21/33  bone]
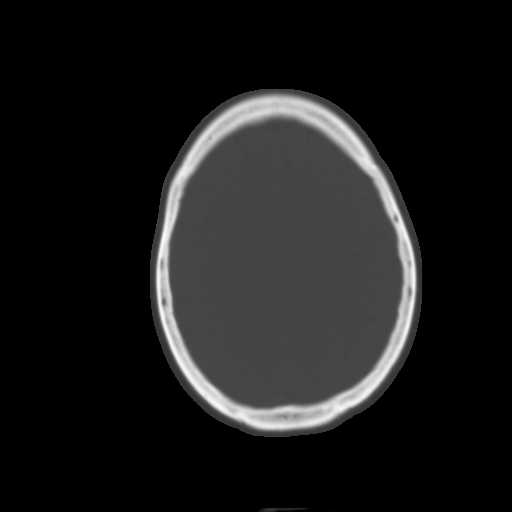
[im 25/33  brain]
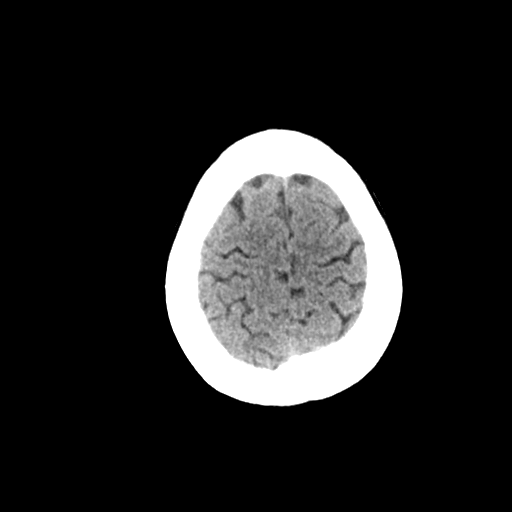
[im 29/33  brain]
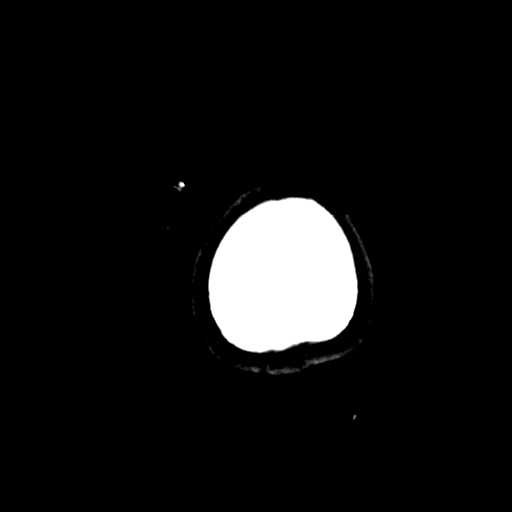

[Series 3: head bone · axial · 0.49mm/px · z∈[-298,-242]mm · 4 of 83 slices shown]
[im 9/83  bone]
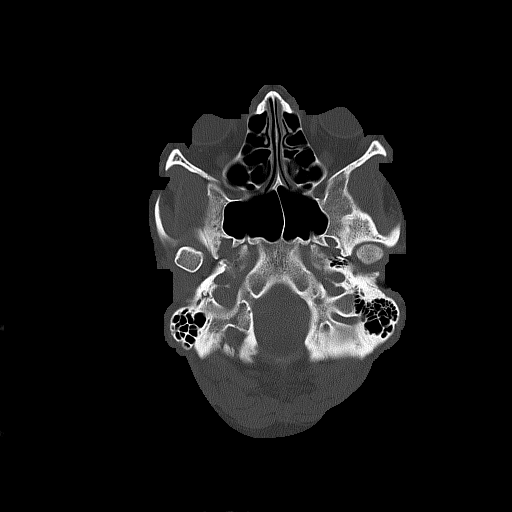
[im 17/83  bone]
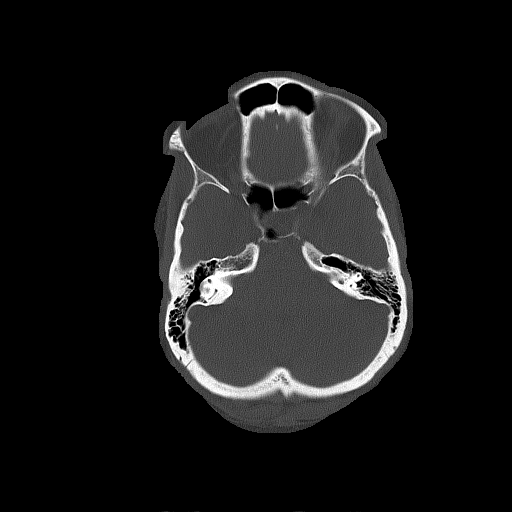
[im 25/83  bone]
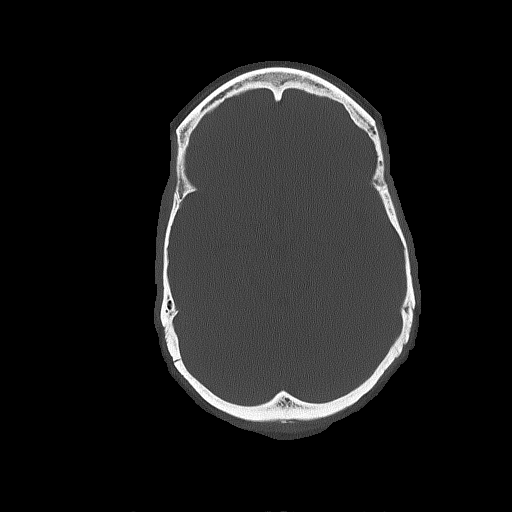
[im 37/83  bone]
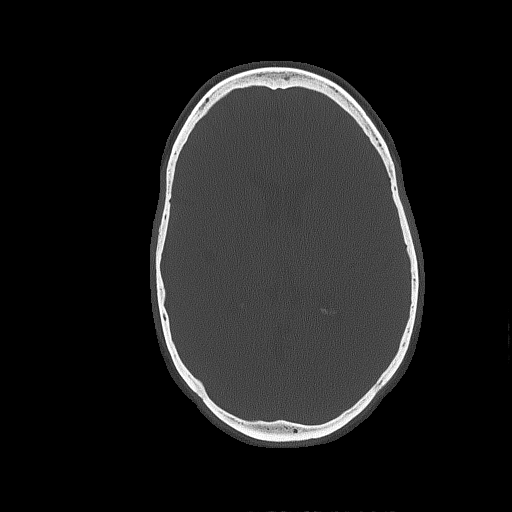

[Series 4: head without cor · coronal · non-contrast · 0.32mm/px · 3 of 70 slices shown]
[im 24/70  brain]
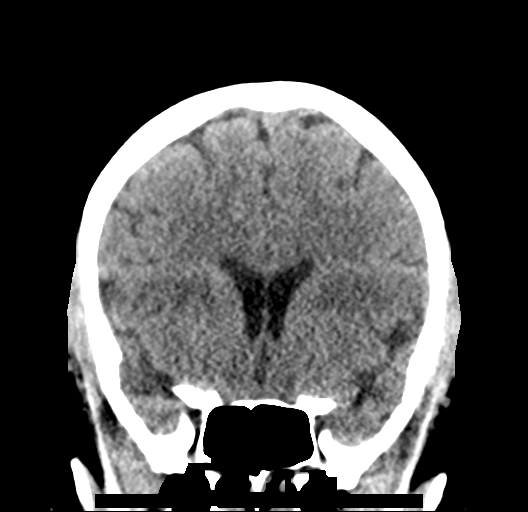
[im 31/70  brain]
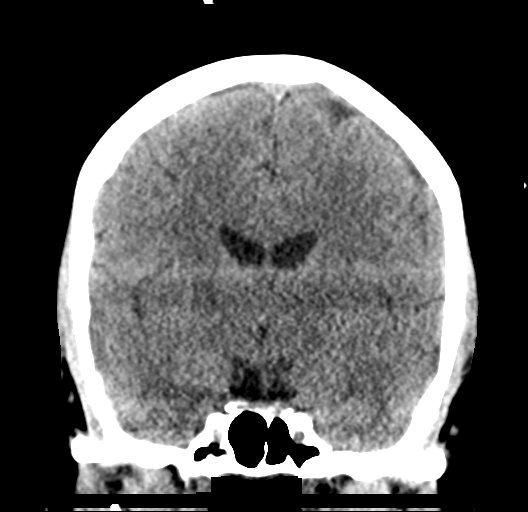
[im 39/70  brain]
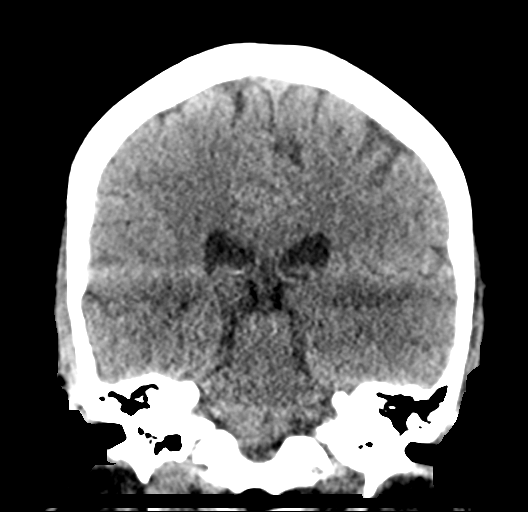

[Series 5: head without sag · sagittal · non-contrast · 0.32mm/px · 3 of 53 slices shown]
[im 18/53  brain]
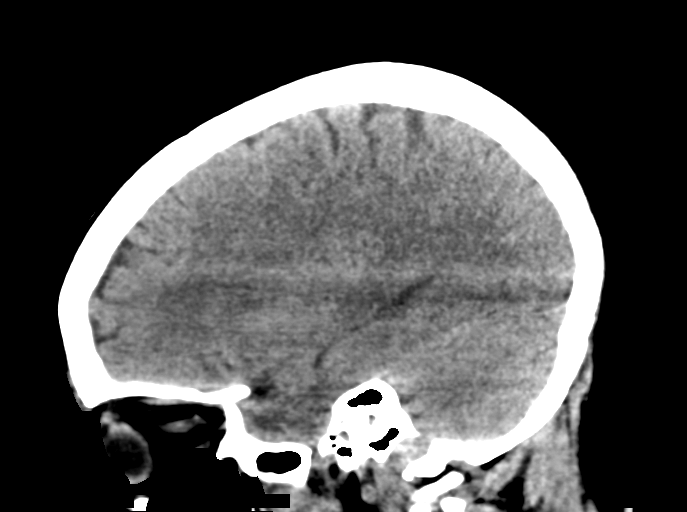
[im 27/53  brain]
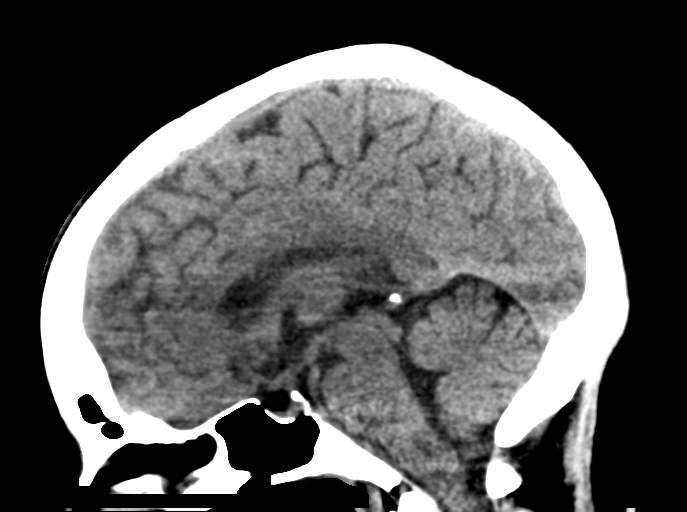
[im 35/53  brain]
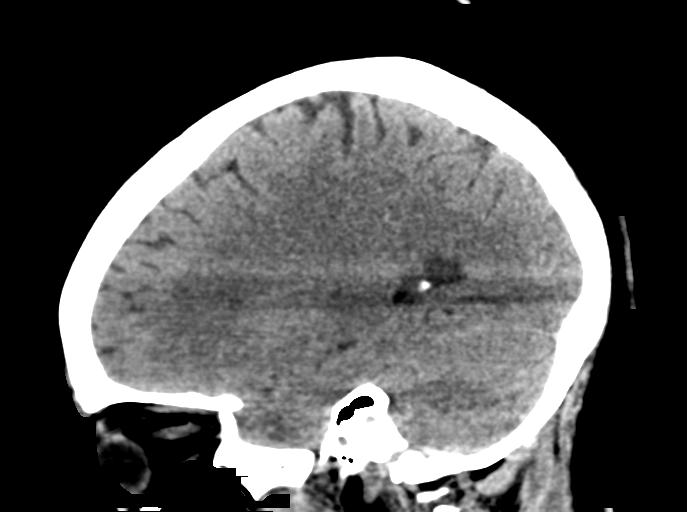

[17 of 47 positions shown; findings below may reference images not displayed]

FINDINGS: Brain: No evidence of acute infarction, hemorrhage, hydrocephalus,
extra-axial collection or mass lesion/mass effect.

Vascular: No hyperdense vessel or unexpected calcification.

Skull: Normal. Negative for fracture or focal lesion.

Sinuses/Orbits: No acute finding.

Other: None
IMPRESSION: Normal unenhanced CT scan of the brain.
